# Patient Record
Sex: Male | Born: 1965 | Hispanic: Yes | Marital: Married | Smoking: Never smoker
Health system: Southern US, Community
[De-identification: ages and names within clinical notes are randomized; demographics above are authoritative.]

## PROBLEM LIST (undated history)

## (undated) ENCOUNTER — Emergency Department (HOSPITAL_COMMUNITY): Discharge status: Against Medical Advice | Payer: Self-pay

## (undated) HISTORY — PX: FRACTURE SURGERY: SHX138

## (undated) HISTORY — PX: OTHER SURGICAL HISTORY: SHX169

---

## 2012-09-12 ENCOUNTER — Encounter (HOSPITAL_COMMUNITY): Payer: Self-pay | Admitting: *Deleted

## 2012-09-12 ENCOUNTER — Emergency Department (HOSPITAL_COMMUNITY)
Admission: EM | Admit: 2012-09-12 | Discharge: 2012-09-12 | Disposition: A | Discharge status: Home / Self Care | Payer: Self-pay | Attending: Emergency Medicine | Admitting: Emergency Medicine

## 2012-09-12 DIAGNOSIS — L408 Other psoriasis: Secondary | ICD-10-CM | POA: Insufficient documentation

## 2012-09-12 DIAGNOSIS — L404 Guttate psoriasis: Secondary | ICD-10-CM

## 2012-09-12 MED ORDER — DIPHENHYDRAMINE HCL 25 MG PO TABS: 50.0000 mg | ORAL_TABLET | ORAL | Status: DC | PRN

## 2012-09-12 MED ORDER — DOXEPIN HCL 25 MG PO CAPS: 25.0000 mg | ORAL_CAPSULE | Freq: Every day | ORAL | Status: DC

## 2012-09-12 MED ORDER — PREDNISONE 20 MG PO TABS: ORAL_TABLET | ORAL | Status: DC

## 2012-09-12 MED ORDER — LORATADINE 10 MG PO TABS: 10.0000 mg | ORAL_TABLET | Freq: Every day | ORAL | Status: DC

## 2012-09-12 NOTE — ED Notes (Signed)
Itching rash for 3 weeks, thinks he is allergic to something at his work.

## 2012-09-12 NOTE — ED Provider Notes (Signed)
History   This chart was scribed for Hurman Horn, MD by Gerlean Ren. This patient was seen in room APA09/APA09 and the patient's care was started at 7:28PM.   CSN: [id number]  Arrival date & time 09/12/12  1914   First MD Initiated Contact with Patient 09/12/12 1924      Chief Complaint  Patient presents with  . Rash    (Consider location/radiation/quality/duration/timing/severity/associated sxs/prior treatment) The history is provided by the patient and a relative. No language interpreter was used.  Tyler Hudson is a 46 y.o. male who presents to the Emergency Department complaining of 3 weeks of constant itchy rash covering entire body for which pt has used prescribed steroid cream and Keflex with no improvements.  Per son, no known contacts with rash.  Pt denies fever, neck pain, sore throat, visual disturbance, CP, cough, SOB, abdominal pain, nausea, emesis, diarrhea, urinary symptoms, back pain, HA, weakness, and numbness as associated symptoms.  Pt has no h/o chronic medical conditions.  Pt denies tobacco and alcohol use.      History reviewed. No pertinent past medical history.  Past Surgical History  Procedure Date  . Fracture surgery   . Fracture surgery rt forearm     History reviewed. No pertinent family history.  History  Substance Use Topics  . Smoking status: Never Smoker   . Smokeless tobacco: Not on file  . Alcohol Use:       Review of Systems 10 Systems reviewed and are negative for acute change except as noted in the HPI.  Allergies  Review of patient's allergies indicates no known allergies.  Home Medications   Current Outpatient Rx  Name Route Sig Dispense Refill  . CEPHALEXIN 500 MG PO CAPS Oral Take 500 mg by mouth 2 (two) times daily. FOR 10 DAY    . HYDROCORTISONE 1 % EX CREA Topical Apply 1 application topically 2 (two) times daily as needed. FOR IRRITATION    . TRIAMCINOLONE ACETONIDE 0.1 % EX CREA Topical Apply 1 application topically 2  (two) times daily. FOR ITCHING AND/OR IRRITATION    . DIPHENHYDRAMINE HCL 25 MG PO TABS Oral Take 2 tablets (50 mg total) by mouth every 4 (four) hours as needed for itching. 20 tablet 0  . DOXEPIN HCL 25 MG PO CAPS Oral Take 1 capsule (25 mg total) by mouth at bedtime. 15 capsule 0  . LORATADINE 10 MG PO TABS Oral Take 1 tablet (10 mg total) by mouth daily. One po daily x 5 days 5 tablet 0  . PREDNISONE 20 MG PO TABS  3 tabs po daily x 3 days, then 2 tabs x 3 days, then 1.5 tabs x 3 days, then 1 tab x 3 days, then 0.5 tabs x 3 days 27 tablet 0    BP 121/65  Pulse 65  Temp 97.8 F (36.6 C) (Oral)  Resp 20  Wt 171 lb 1.6 oz (77.61 kg)  SpO2 97%  Physical Exam  Nursing note and vitals reviewed. Constitutional:       Awake, alert, nontoxic appearance.  HENT:  Head: Atraumatic.  Eyes: Right eye exhibits no discharge. Left eye exhibits no discharge.  Neck: Neck supple.  Cardiovascular: Normal rate and regular rhythm.   No murmur heard. Pulmonary/Chest: Effort normal. He has no wheezes. He exhibits no tenderness.  Abdominal: Soft. There is no tenderness. There is no rebound.  Musculoskeletal: He exhibits no tenderness.       Baseline ROM, no obvious new focal weakness.  Neurological:       Mental status and motor strength appears baseline for patient and situation.  Skin: No rash noted.       Generalized erythematous papules and plaques 1-3cm in diameter with some scaling and multiple superficial excoriations from scratching without vesicles, petechiae or purpura.  Healed lesions have post-inflammatory hyperpigmentation.    Psychiatric: He has a normal mood and affect.    ED Course  Procedures (including critical care time) DIAGNOSTIC STUDIES: Oxygen Saturation is 97% on room air, adequate by my interpretation.    COORDINATION OF CARE: 7:36PM- Patient and family informed of clinical course, understand medical decision-making process, and agree with plan.  Ordered deltasone,  benadryl, Claritin, and sinequan for discharge.    Labs Reviewed - No data to display No results found.   1. Guttate psoriasis       MDM   Pt stable in ED with no significant deterioration in condition.  Patient / Family / Caregiver informed of clinical course, understand medical decision-making process, and agree with plan.  I doubt any other EMC precluding discharge at this time including, but not necessarily limited to the following:SBI, TEN. SJS.  I personally performed the services described in this documentation, which was scribed in my presence. The recorded information has been reviewed and considered.        Hurman Horn, MD 09/14/12 [phone]

## 2012-09-12 NOTE — ED Notes (Signed)
Child at bedside interprets for his father and states that he has had a rash all over for a while.

## 2013-01-06 ENCOUNTER — Encounter (HOSPITAL_COMMUNITY): Payer: Self-pay | Admitting: *Deleted

## 2013-01-06 ENCOUNTER — Emergency Department (HOSPITAL_COMMUNITY)
Admission: EM | Admit: 2013-01-06 | Discharge: 2013-01-06 | Disposition: A | Discharge status: Home / Self Care | Payer: Self-pay | Attending: Emergency Medicine | Admitting: Emergency Medicine

## 2013-01-06 DIAGNOSIS — L409 Psoriasis, unspecified: Secondary | ICD-10-CM

## 2013-01-06 DIAGNOSIS — L408 Other psoriasis: Secondary | ICD-10-CM | POA: Insufficient documentation

## 2013-01-06 DIAGNOSIS — L299 Pruritus, unspecified: Secondary | ICD-10-CM | POA: Insufficient documentation

## 2013-01-06 MED ORDER — PREDNISONE 10 MG PO TABS
60.0000 mg | ORAL_TABLET | Freq: Once | ORAL | Status: AC
  Administered 2013-01-06: 60 mg via ORAL
  Filled 2013-01-06: qty 1

## 2013-01-06 MED ORDER — PREDNISONE 10 MG PO TABS: ORAL_TABLET | ORAL | Status: DC

## 2013-01-06 MED ORDER — HYDROXYZINE HCL 25 MG PO TABS
25.0000 mg | ORAL_TABLET | Freq: Once | ORAL | Status: AC
  Administered 2013-01-06: 25 mg via ORAL
  Filled 2013-01-06: qty 1

## 2013-01-06 MED ORDER — HYDROXYZINE HCL 25 MG PO TABS: 25.0000 mg | ORAL_TABLET | Freq: Four times a day (QID) | ORAL | Status: DC

## 2013-01-06 NOTE — ED Provider Notes (Signed)
History     CSN: [id number]  Arrival date & time 01/06/13  1851   First MD Initiated Contact with Patient 01/06/13 1912      No chief complaint on file.   (Consider location/radiation/quality/duration/timing/severity/associated sxs/prior treatment) HPI Comments: Patient c/o chronic rash and itching for 8-9 months.  States he was seen here previously for same but symptoms have not improved.  He states the lesions began on his legs and are now present to most of his body.  He denies new medications, exposure to chemicals, recent travel out of the country, fever, joint pain or swelling.  PAtient   Patient is a 47 y.o. male presenting with rash. The history is provided by the patient.  Rash  This is a chronic problem. Episode onset: 8 months. The problem has been gradually worsening. The problem is associated with an unknown factor. There has been no fever. The rash is present on the back, abdomen, groin, trunk, left upper leg, left lower leg, right upper leg, right lower leg, left arm and right arm. The patient is experiencing no pain. Associated symptoms include itching. Pertinent negatives include no blisters, no pain and no weeping. He has tried anti-itch cream and steriods for the symptoms. The treatment provided mild relief.    History reviewed. No pertinent past medical history.  Past Surgical History  Procedure Date  . Fracture surgery   . Fracture surgery rt forearm     History reviewed. No pertinent family history.  History  Substance Use Topics  . Smoking status: Never Smoker   . Smokeless tobacco: Not on file  . Alcohol Use: No      Review of Systems  Constitutional: Negative for fever, chills, activity change and appetite change.  HENT: Negative for sore throat, facial swelling, trouble swallowing, neck pain and neck stiffness.   Respiratory: Negative for chest tightness, shortness of breath and wheezing.   Gastrointestinal: Negative for nausea and vomiting.    Genitourinary: Negative for dysuria.  Musculoskeletal: Negative for myalgias, arthralgias and gait problem.  Skin: Positive for itching and rash. Negative for color change and wound.  Neurological: Negative for dizziness, weakness, numbness and headaches.  Hematological: Negative for adenopathy.  All other systems reviewed and are negative.    Allergies  Review of patient's allergies indicates no known allergies.  Home Medications   Current Outpatient Rx  Name  Route  Sig  Dispense  Refill  . HYDROCORTISONE 1 % EX CREA   Topical   Apply 1 application topically 2 (two) times daily as needed. FOR IRRITATION         . HYDROXYZINE HCL 25 MG PO TABS   Oral   Take 1 tablet (25 mg total) by mouth every 6 (six) hours. Prn itching   20 tablet   0   . PREDNISONE 10 MG PO TABS      Take 6 tablets day one, 5 tablets day two, 4 tablets day three, 3 tablets day four, 2 tablets day five, then 1 tablet day six   21 tablet   0     BP 131/71  Pulse 60  Temp 98.7 F (37.1 C) (Oral)  Resp 20  Ht 5\' 5"  (1.651 m)  Wt 173 lb 2 oz (78.529 kg)  BMI 28.81 kg/m2  SpO2 100%  Physical Exam  Nursing note and vitals reviewed. Constitutional: He is oriented to person, place, and time. He appears well-developed and well-nourished. No distress.  HENT:  Head: Normocephalic and atraumatic.  Mouth/Throat:  Oropharynx is clear and moist.  Neck: Normal range of motion. Neck supple.  Cardiovascular: Normal rate, regular rhythm, normal heart sounds and intact distal pulses.   No murmur heard. Pulmonary/Chest: Effort normal and breath sounds normal.  Musculoskeletal: He exhibits no edema and no tenderness.  Lymphadenopathy:    He has no cervical adenopathy.  Neurological: He is alert and oriented to person, place, and time. He exhibits normal muscle tone. Coordination normal.  Skin: Skin is warm and dry. Rash noted. No erythema.       Multiple erythematous , scaly plaques of various sizes to most  of the body.  Scattered excoriations.  No bulla , weeping or vesicles    ED Course  Procedures (including critical care time)  Labs Reviewed - No data to display No results found.   1. Psoriasis       MDM   Previous ED chart reviewed.    Patient was seen here in October for same.  Has not seen dermatology or had a follow-up appt .  I have discussed at length with the patient that he will close f/u with dermatology for this condition.  Referral info given.  Pt is otherwise well appearing    Prescribed: Prednisone taper vistaril  Tyler Hudson L. [place], [place] 01/07/13 [phone]

## 2013-01-06 NOTE — ED Notes (Signed)
Pt reporting generalized rash for about 8 months.  Reports no relief from OTC creams.

## 2013-01-06 NOTE — ED Notes (Signed)
Generalized, itching rash present on legs x 2 years, present on trunk for 8 mos.

## 2013-01-07 NOTE — ED Provider Notes (Signed)
Medical screening examination/treatment/procedure(s) were performed by non-physician practitioner and as supervising physician I was immediately available for consultation/collaboration.  As discussed with [name], patient needs dermatology follow up. Referral options given.   Shelda Jakes, MD 01/07/13 [phone]

## 2013-09-28 ENCOUNTER — Emergency Department (HOSPITAL_COMMUNITY)
Admission: EM | Admit: 2013-09-28 | Discharge: 2013-09-28 | Disposition: A | Discharge status: Home / Self Care | Payer: Self-pay | Attending: Emergency Medicine | Admitting: Emergency Medicine

## 2013-09-28 ENCOUNTER — Encounter (HOSPITAL_COMMUNITY): Payer: Self-pay | Admitting: Emergency Medicine

## 2013-09-28 ENCOUNTER — Emergency Department (HOSPITAL_COMMUNITY): Payer: Self-pay

## 2013-09-28 DIAGNOSIS — L0201 Cutaneous abscess of face: Secondary | ICD-10-CM | POA: Insufficient documentation

## 2013-09-28 DIAGNOSIS — L03211 Cellulitis of face: Secondary | ICD-10-CM | POA: Insufficient documentation

## 2013-09-28 LAB — BASIC METABOLIC PANEL
CO2: 30 mEq/L (ref 19–32)
Calcium: 9.3 mg/dL (ref 8.4–10.5)
GFR calc non Af Amer: >90 mL/min (ref >90)
Glucose, Bld: 104 mg/dL — ABNORMAL HIGH (ref 70–99)
Potassium: 4.1 mEq/L (ref 3.5–5.1)
Sodium: 135 mEq/L (ref 135–145)

## 2013-09-28 LAB — CBC WITH DIFFERENTIAL/PLATELET
Basophils Absolute: 0 10*3/uL (ref 0.0–0.1)
Eosinophils Relative: 2 % (ref 0–5)
Lymphocytes Relative: 16 % (ref 12–46)
Lymphs Abs: 1.8 10*3/uL (ref 0.7–4.0)
Neutro Abs: 7.8 10*3/uL — ABNORMAL HIGH (ref 1.7–7.7)
Neutrophils Relative %: 71 % (ref 43–77)
Platelets: 148 10*3/uL — ABNORMAL LOW (ref 150–400)
RBC: 5.3 MIL/uL (ref 4.22–5.81)
RDW: 12.7 % (ref 11.5–15.5)
WBC: 11.1 10*3/uL — ABNORMAL HIGH (ref 4.0–10.5)

## 2013-09-28 MED ORDER — IOHEXOL 300 MG/ML  SOLN
100.0000 mL | Freq: Once | INTRAMUSCULAR | Status: AC | PRN
  Administered 2013-09-28: 75 mL via INTRAVENOUS

## 2013-09-28 MED ORDER — CLINDAMYCIN HCL 150 MG PO CAPS: ORAL_CAPSULE | ORAL | Status: DC

## 2013-09-28 MED ORDER — SODIUM CHLORIDE 0.9 % IV SOLN
INTRAVENOUS | Status: DC
  Administered 2013-09-28: 15:00:00 via INTRAVENOUS

## 2013-09-28 MED ORDER — CLINDAMYCIN PHOSPHATE 900 MG/50ML IV SOLN
900.0000 mg | Freq: Once | INTRAVENOUS | Status: AC
  Administered 2013-09-28: 900 mg via INTRAVENOUS
  Filled 2013-09-28: qty 50

## 2013-09-28 NOTE — ED Notes (Addendum)
C/O  L earache x 3 days.    Redness and swelling noted to left cheek.

## 2013-09-28 NOTE — ED Provider Notes (Signed)
CSN: [id number]     Arrival date & time 09/28/13  1245 History   First MD Initiated Contact with Patient 09/28/13 1300     Chief Complaint  Patient presents with  . Otalgia    HPI Pt was seen at 1435. Per pt and his family, c/o gradual onset and persistence of constant left facial "rash" for the past 3 days. Has been associated with left ear "pain." Denies injury, no fevers, no open wounds, no mouth pain, no eye pain, no headache, no neck pain, no SOB.    History reviewed. No pertinent past medical history.  Past Surgical History  Procedure Laterality Date  . Fracture surgery    . Fracture surgery rt forearm      History  Substance Use Topics  . Smoking status: Never Smoker   . Smokeless tobacco: Not on file  . Alcohol Use: No    Review of Systems ROS: Statement: All systems negative except as marked or noted in the HPI; Constitutional: Negative for fever and chills. ; ; Eyes: Negative for eye pain, redness and discharge. ; ; ENMT: Negative for ear pain, hoarseness, nasal congestion, sinus pressure and sore throat. ; ; Cardiovascular: Negative for chest pain, palpitations, diaphoresis, dyspnea and peripheral edema. ; ; Respiratory: Negative for cough, wheezing and stridor. ; ; Gastrointestinal: Negative for nausea, vomiting, diarrhea, abdominal pain, blood in stool, hematemesis, jaundice and rectal bleeding. . ; ; Genitourinary: Negative for dysuria, flank pain and hematuria. ; ; Musculoskeletal: Negative for back pain and neck pain. Negative for swelling and trauma.; ; Skin: +left facial rash. Negative for pruritus, abrasions, blisters, bruising and skin lesion.; ; Neuro: Negative for headache, lightheadedness and neck stiffness. Negative for weakness, altered level of consciousness , altered mental status, extremity weakness, paresthesias, involuntary movement, seizure and syncope.       Allergies  Review of patient's allergies indicates no known allergies.  Home Medications    Current Outpatient Rx  Name  Route  Sig  Dispense  Refill  . hydrocortisone cream (ANTI-ITCH MAXIMUM STRENGTH) 1 %   Topical   Apply 1 application topically 2 (two) times daily as needed. FOR IRRITATION         . hydrOXYzine (ATARAX/VISTARIL) 25 MG tablet   Oral   Take 1 tablet (25 mg total) by mouth every 6 (six) hours. Prn itching   20 tablet   0   . predniSONE (DELTASONE) 10 MG tablet      Take 6 tablets day one, 5 tablets day two, 4 tablets day three, 3 tablets day four, 2 tablets day five, then 1 tablet day six   21 tablet   0    BP 115/69  Pulse 74  Temp(Src) 98.8 F (37.1 C) (Oral)  Resp 18  Ht 5\' 2"  (1.575 m)  Wt 166 lb (75.297 kg)  BMI 30.35 kg/m2  SpO2 96% Physical Exam 1440: Physical examination:  Nursing notes reviewed; Vital signs and O2 SAT reviewed;  Constitutional: Well developed, Well nourished, Well hydrated, In no acute distress; Head:  Normocephalic, atraumatic. +approx 6x7cm diameter area on left lateral zygoma/cheek with erythema and induration, no tenderness, no blisters/vesicles, no open wounds, no palp abscess/fluctuance, no ecchymosis, no soft tissue crepitus. ; Eyes: EOMI, PERRL, No scleral icterus; ENMT: TM's clear bilat. Left external auditory canal without edema, debris, open wounds, erythema. Left pinnae without edema or erythema. Mouth and pharynx normal, Mucous membranes moist; Neck: Supple, Full range of motion, No lymphadenopathy; Cardiovascular: Regular rate and rhythm,  No gallop; Respiratory: Breath sounds clear & equal bilaterally, No rales, rhonchi, wheezes.  Speaking full sentences with ease, Normal respiratory effort/excursion; Chest: Nontender, Movement normal; Abdomen: Soft, Nontender, Nondistended, Normal bowel sounds; Genitourinary: No CVA tenderness; Extremities: Pulses normal, No tenderness, No edema, No calf edema or asymmetry.; Neuro: AA&Ox3, Major CN grossly intact.  Speech clear. Climbs on and off stretcher easily by himself. Gait  steady. No gross focal motor or sensory deficits in extremities.; Skin: Color normal, Warm, Dry.   ED Course  Procedures   EKG Interpretation   None       MDM  MDM Reviewed: previous chart, nursing note and vitals Interpretation: labs and CT scan   Results for orders placed during the hospital encounter of 09/28/13  CBC WITH DIFFERENTIAL      Result Value Range   WBC 11.1 (*) 4.0 - 10.5 K/uL   RBC 5.30  4.22 - 5.81 MIL/uL   Hemoglobin 16.2  13.0 - 17.0 g/dL   HCT 16.1  09.6 - 04.5 %   MCV 88.5  78.0 - 100.0 fL   MCH 30.6  26.0 - 34.0 pg   MCHC 34.5  30.0 - 36.0 g/dL   RDW 40.9  81.1 - 91.4 %   Platelets 148 (*) 150 - 400 K/uL   Neutrophils Relative % 71  43 - 77 %   Neutro Abs 7.8 (*) 1.7 - 7.7 K/uL   Lymphocytes Relative 16  12 - 46 %   Lymphs Abs 1.8  0.7 - 4.0 K/uL   Monocytes Relative 11  3 - 12 %   Monocytes Absolute 1.2 (*) 0.1 - 1.0 K/uL   Eosinophils Relative 2  0 - 5 %   Eosinophils Absolute 0.2  0.0 - 0.7 K/uL   Basophils Relative 0  0 - 1 %   Basophils Absolute 0.0  0.0 - 0.1 K/uL  BASIC METABOLIC PANEL      Result Value Range   Sodium 135  135 - 145 mEq/L   Potassium 4.1  3.5 - 5.1 mEq/L   Chloride 101  96 - 112 mEq/L   CO2 30  19 - 32 mEq/L   Glucose, Bld 104 (*) 70 - 99 mg/dL   BUN 18  6 - 23 mg/dL   Creatinine, Ser 7.82  0.50 - 1.35 mg/dL   Calcium 9.3  8.4 - 95.6 mg/dL   GFR calc non Af Amer >90  >90 mL/min   GFR calc Af Amer >90  >90 mL/min   Ct Maxillofacial W/cm 09/28/2013   CLINICAL DATA:  Aerated. Left facial swelling.  EXAM: CT MAXILLOFACIAL WITH CONTRAST  TECHNIQUE: Multidetector CT imaging of the maxillofacial structures was performed with intravenous contrast. Multiplanar CT image reconstructions were also generated. A small metallic BB was placed on the right temple in order to reliably differentiate right from left.  CONTRAST:  75mL OMNIPAQUE IOHEXOL 300 MG/ML  SOLN  COMPARISON:  None.  FINDINGS: Inflammatory changes of the subcutaneous  tissues over the left parotid gland are noted. No evidence of inflammatory change within the parotid gland. Inflammatory change of the soft tissue of the external left auditory canal is present. This extends superiorly into the temporal region but again only involves the overlying subcutaneous tissues. Small inflammatory nodes are seen about the left parotid gland.  There is no evidence of abscess  Airway is patent. Larynx and epiglottis are unremarkable. Thyroid gland was not imaged  Jugular veins are patent. Carotid arteries are grossly patent.  No abnormal adenopathy by measurement criteria.  Visualized mastoid air cells are clear. Minimal mucosal thickening in the maxillary sinuses.  IMPRESSION: Inflammatory changes of the soft tissues involving the left external auditory canal and soft tissues overlying the left parotid gland. No abscess.   Electronically Signed   By: Maryclare Bean M.D.   On: 09/28/2013 15:35     1705:  IV clindamycin given while in the ED. No abscess via CT scan. Pt remains afebrile with stable VS. Pt wants to go home. Pt strongly encouraged to f/u with PMD in the next 24 to 48 hours for re-check; will rx clindamycin. Verb understanding and agreeable. Dx and testing d/w pt and family.  Questions answered.  Verb understanding, agreeable to d/c home with outpt f/u.     Laray Anger, DO 09/30/13 1126

## 2014-09-18 ENCOUNTER — Encounter (HOSPITAL_COMMUNITY): Payer: Self-pay | Admitting: Emergency Medicine

## 2014-09-18 ENCOUNTER — Emergency Department (HOSPITAL_COMMUNITY): Payer: Self-pay

## 2014-09-18 ENCOUNTER — Emergency Department (HOSPITAL_COMMUNITY)
Admission: EM | Admit: 2014-09-18 | Discharge: 2014-09-19 | Disposition: A | Discharge status: Home / Self Care | Payer: Self-pay | Attending: Emergency Medicine | Admitting: Emergency Medicine

## 2014-09-18 DIAGNOSIS — Z79899 Other long term (current) drug therapy: Secondary | ICD-10-CM | POA: Insufficient documentation

## 2014-09-18 DIAGNOSIS — Z7952 Long term (current) use of systemic steroids: Secondary | ICD-10-CM | POA: Insufficient documentation

## 2014-09-18 DIAGNOSIS — R319 Hematuria, unspecified: Secondary | ICD-10-CM | POA: Insufficient documentation

## 2014-09-18 DIAGNOSIS — R109 Unspecified abdominal pain: Secondary | ICD-10-CM

## 2014-09-18 LAB — URINE MICROSCOPIC-ADD ON

## 2014-09-18 LAB — URINALYSIS, ROUTINE W REFLEX MICROSCOPIC
BILIRUBIN URINE: NEGATIVE
Glucose, UA: NEGATIVE mg/dL
KETONES UR: NEGATIVE mg/dL
Leukocytes, UA: NEGATIVE
NITRITE: NEGATIVE
PH: 6 (ref 5.0–8.0)
Protein, ur: NEGATIVE mg/dL
Specific Gravity, Urine: 1.02 (ref 1.005–1.030)
Urobilinogen, UA: 1 mg/dL (ref 0.0–1.0)

## 2014-09-18 NOTE — ED Provider Notes (Signed)
CSN: [id number]     Arrival date & time 09/18/14  2144 History   First MD Initiated Contact with Patient 09/18/14 2242    This chart was scribed for non-physician practitioner, Ivery QualeHobson Jathen Sudano, PA, working with Vida RollerBrian D Miller, MD by Marica OtterNusrat Rahman, ED Scribe. This patient was seen in room APA05/APA05 and the patient's care was started at 10:45 PM.  Chief Complaint  Patient presents with  . Abdominal Pain   HPI PCP: No PCP Per Patient HPI Comments: Tyler Hudson is a 48 y.o. male who presents to the Emergency Department complaining of atraumatic, intermittent, RLQ abd pain onset one week ago. Pt also complains of intermittent heat in the said area. Pt notes the pain is worse when he eats or when there is movement (including walking, standing from a sitting position, sitting from a laying position). Pt denies fever, constipation, diarrhea, blood in stool, blood in urine, Hx of similar pain, chronic medical problems, n/v.  History reviewed. No pertinent past medical history. History reviewed. No pertinent past surgical history. History reviewed. No pertinent family history. History  Substance Use Topics  . Smoking status: Never Smoker   . Smokeless tobacco: Not on file  . Alcohol Use: No    Review of Systems  Gastrointestinal: Positive for abdominal pain (RLQ). Negative for nausea, vomiting, diarrhea, constipation and blood in stool.  Psychiatric/Behavioral: Negative for confusion.  All other systems reviewed and are negative.   Allergies  Review of patient's allergies indicates no known allergies.  Home Medications   Prior to Admission medications   Medication Sig Start Date End Date Taking? Authorizing Provider  clindamycin (CLEOCIN) 150 MG capsule 3 tabs PO TID x 10 days 09/28/13   Samuel JesterKathleen McManus, DO  hydrocortisone cream (ANTI-ITCH MAXIMUM STRENGTH) 1 % Apply 1 application topically 2 (two) times daily as needed. FOR IRRITATION    Historical Provider, MD  hydrOXYzine  (ATARAX/VISTARIL) 25 MG tablet Take 1 tablet (25 mg total) by mouth every 6 (six) hours. Prn itching 01/06/13   [name] L. Triplett, PA-C  predniSONE (DELTASONE) 10 MG tablet Take 6 tablets day one, 5 tablets day two, 4 tablets day three, 3 tablets day four, 2 tablets day five, then 1 tablet day six 01/06/13   [name] L. Rowe Robertriplett, PA-C    Physical Exam  Nursing note and vitals reviewed. Constitutional: He is oriented to person, place, and time. He appears well-developed and well-nourished. No distress.  HENT:  Head: Normocephalic and atraumatic.  Mouth/Throat: Oropharynx is clear and moist.  Eyes: Conjunctivae and EOM are normal.  Neck: Normal range of motion. Neck supple. No tracheal deviation present.  Cardiovascular: Normal rate.   Pulmonary/Chest: Effort normal. No respiratory distress.  Abdominal: Bowel sounds are normal. There is tenderness (RLQ pain to palpation ).  No left side pain. Right CVA tenderness  Musculoskeletal: Normal range of motion.  Lymphadenopathy:    He has no cervical adenopathy.  Neurological: He is alert and oriented to person, place, and time.  Skin: Skin is warm and dry.  Psychiatric: He has a normal mood and affect. His behavior is normal.    ED Course  Procedures (including critical care time)   COORDINATION OF CARE: 10:52 PM-Discussed treatment plan which includes labs, UA, and imaging with pt at bedside and pt agreed to plan.   Labs Review Labs Reviewed  URINALYSIS, ROUTINE W REFLEX MICROSCOPIC - Abnormal; Notable for the following:    Hgb urine dipstick LARGE (*)    All other components within normal  limits  URINE MICROSCOPIC-ADD ON    Imaging Review No results found.   EKG Interpretation None      MDM  Patient is with one week of right lower back pain and right lower quadrant abdomen pain. No reported injury, and no fever reported. Urinalysis reveals a large hemoglobin on the urine analysis, and 21-50 red blood cells present.. A CT scan is  to be obtained.  CT scan reveals no acute abnormality of the abdomen or pelvis. 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  Patient will be advised to see urology for evaluation of the hematuria. Patient will be treated with Norco every 4 hours for pain as well as baclofen. Patient will be advised to return sooner if any changes or problems.    Final diagnoses:  None    *I have reviewed nursing notes, vital signs, and all appropriate lab and imaging results for this patient.**  **I personally performed the services described in this documentation, which was scribed in my presence. The recorded information has been reviewed and is accurate.Kathie Dike*   Concettina Leth M Quintasia Theroux, PA-C 09/19/14 [phone]

## 2014-09-18 NOTE — ED Notes (Signed)
Patient states RLQ pain X1 week that in worse after he eats. Denies N/V/D

## 2014-09-19 MED ORDER — HYDROCODONE-ACETAMINOPHEN 5-325 MG PO TABS: 1.0000 | ORAL_TABLET | ORAL | Status: AC | PRN

## 2014-09-19 MED ORDER — METHOCARBAMOL 500 MG PO TABS: 500.0000 mg | ORAL_TABLET | Freq: Three times a day (TID) | ORAL | Status: AC

## 2014-09-19 MED ORDER — METHOCARBAMOL 500 MG PO TABS
1000.0000 mg | ORAL_TABLET | Freq: Once | ORAL | Status: AC
  Administered 2014-09-19: 1000 mg via ORAL
  Filled 2014-09-19: qty 2

## 2014-09-19 MED ORDER — HYDROCODONE-ACETAMINOPHEN 5-325 MG PO TABS
2.0000 | ORAL_TABLET | Freq: Once | ORAL | Status: AC
  Administered 2014-09-19: 2 via ORAL
  Filled 2014-09-19: qty 2

## 2014-09-19 NOTE — ED Provider Notes (Signed)
Medical screening examination/treatment/procedure(s) were performed by non-physician practitioner and as supervising physician I was immediately available for consultation/collaboration.    Laikyn Gewirtz D Aleighna Wojtas, MD 09/19/14 1616 

## 2014-09-19 NOTE — ED Notes (Signed)
Patient verbalizes understanding of discharge instructions, follow up care, prescription medications, and home care. Patient ambulatory out of department at this time, escorted by family. 

## 2014-09-19 NOTE — Discharge Instructions (Signed)
Your urine analysis revealed some blood in the urine. 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 Please call the urology specialist listed above for additional evaluation and management of this blood in the urine. Please use Robaxin and Norco for pain. These medications may cause drowsiness, please use with caution. Please strain all urine until seen by the urology specialist. Hematuria (Hematuria) La hematuria es la presencia de sangre en la orina. La causa puede ser una infeccin en la vejiga, en los riones, en la prstata, clculos renales o cncer de las vas urinarias. Normalmente las infecciones pueden tratarse con medicamentos, y los clculos renales, por lo general, se eliminarn a travs de la orina. Si ninguna de estas es la causa de su hematuria, quizs se necesiten ms estudios para Building services engineerdescubrir el motivo. Es muy importante que le informe a su mdico si ve sangre en la [place], aunque el sangrado se detenga sin tratamiento o no cause dolor. La sangre que aparece en la orina y luego se detiene y vuelve a aparecer nuevamente puede ser un sntoma de una enfermedad muy grave. Adems, el dolor no es un sntoma en las etapas iniciales de muchos tipos de cncer urinarios. INSTRUCCIONES PARA EL CUIDADO EN EL HOGAR   Beba mucho lquido, entre 3 a Risk analyst4litros por da. Si le han diagnosticado una infeccin, se recomienda especialmente el jugo de arndanos rojos, [address] de grandes cantidades de [place].  Evite la cafena, el t y las bebidas con gas porque suelen irritar la vejiga.  Evite el alcohol ya que puede Teacher, adult educationirritar la prstata.  Tome todos los [employer] Companymedicamentos como se lo haya indicado el mdico.  Si le recetaron antibiticos, asegrese de terminarlos, incluso si comienza a sentirse mejor.  Si le han diagnosticado clculos renales, siga las instrucciones de su mdico con respecto a Ecologistfiltrar la orina para TEFL teacherrescatar el clculo.  Vace la vejiga con frecuencia. Evite  retener la orina durante largos perodos.  Despus de defecar, las mujeres deben higienizarse desde adelante hacia atrs. Use solo un papel por vez.  Si es AmerisourceBergen Corporationuna mujer, vace la vejiga antes y despus de Management consultanttener relaciones sexuales. SOLICITE ATENCIN MDICA SI:  Siente dolor en la espalda.  Tiene fiebre.  Tiene sensacin de Programme researcher, broadcasting/film/videomalestar estomacal (nuseas) o vmitos.  Los sntomas no mejoran despus de [address] das. Si la condicin empeora, visite al mdico antes de lo previsto. SOLICITE ATENCIN MDICA DE INMEDIATO SI:   Vomita con frecuencia e intensamente y no puede tolerar los medicamentos.  Siente un dolor intenso en la espalda o el abdomen incluso tomando los medicamentos.  Elimina cogulos grandes o sangre con la [place].  Se siente extremadamente dbil, se desmaya o pierde el conocimiento. ASEGRESE DE QUE:   Comprende estas instrucciones.  Controlar su afeccin.  Recibir ayuda de inmediato si no mejora o si empeora. Document Released: 11/27/2005 Document Revised: 04/13/2014 [facility] Patient Information 2015 [employer], [place]. This information is not intended to replace advice given to you by your health care provider. Make sure you discuss any questions you have with your health care provider.

## 2015-08-27 IMAGING — CT CT RENAL STONE PROTOCOL
2 of 4 series · 16 of 46 positions shown, 18 images · non-contrast
Comparison: None.

CLINICAL DATA: Acute onset of right flank pain for 2 weeks.

EXAM:
CT ABDOMEN AND PELVIS WITHOUT CONTRAST
TECHNIQUE: Multidetector CT imaging of the abdomen and pelvis was performed
following the standard protocol without IV contrast.

[Series 2: standard/full over (age)lbs 5.0 · axial · 0.69mm/px · z∈[-458,-22]mm · 13 of 95 slices shown, 15 images]
[im 4/95  soft-tissue]
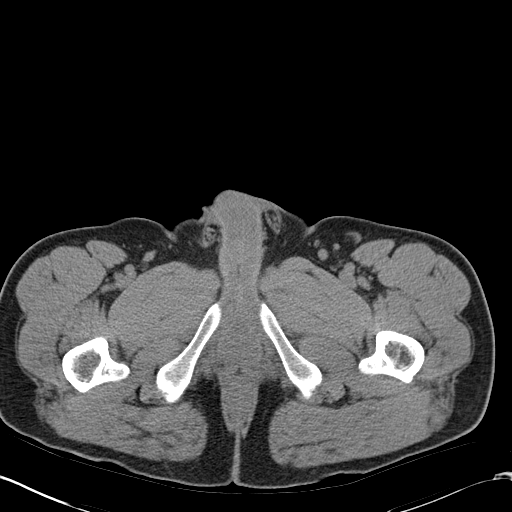
[im 4/95  bone]
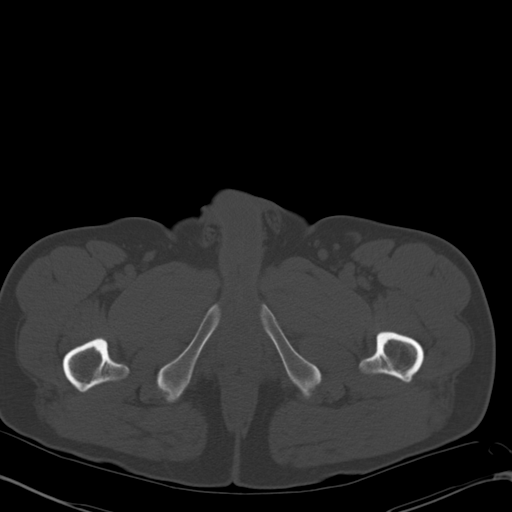
[im 11/95  soft-tissue]
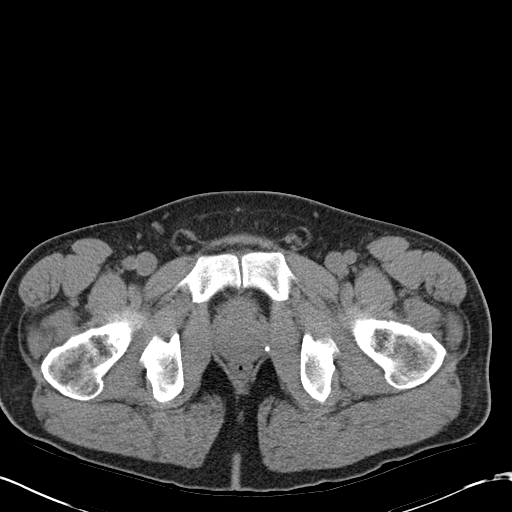
[im 19/95  soft-tissue]
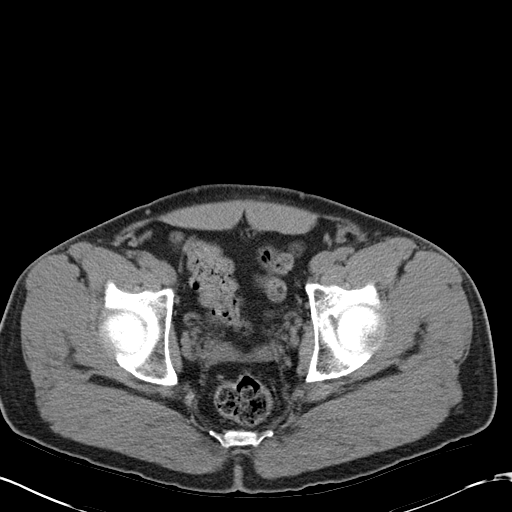
[im 26/95  soft-tissue]
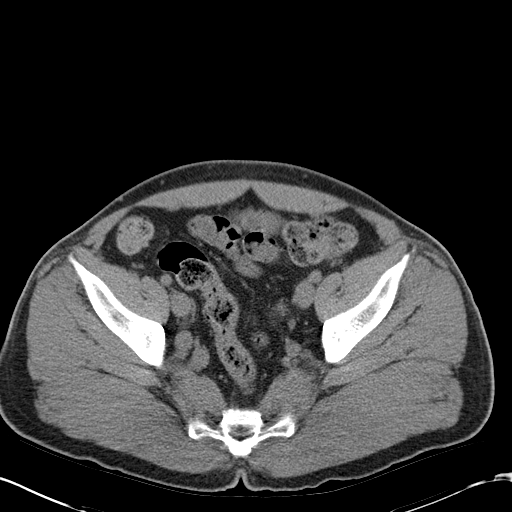
[im 33/95  soft-tissue]
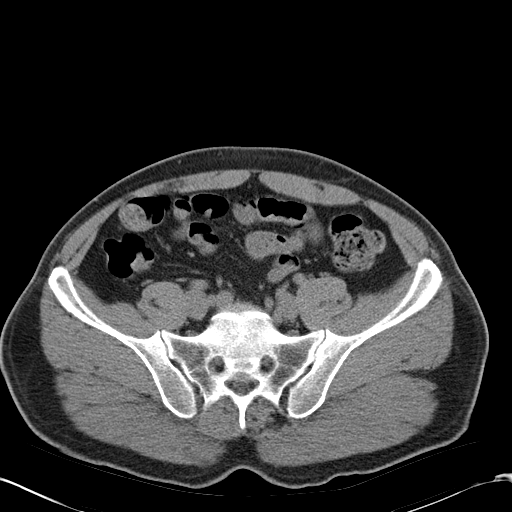
[im 40/95  soft-tissue]
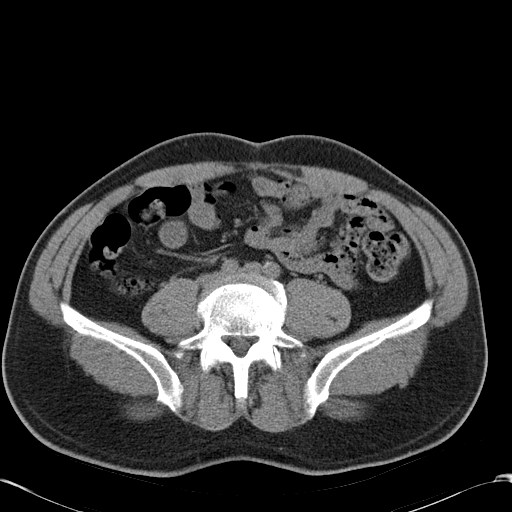
[im 48/95  soft-tissue]
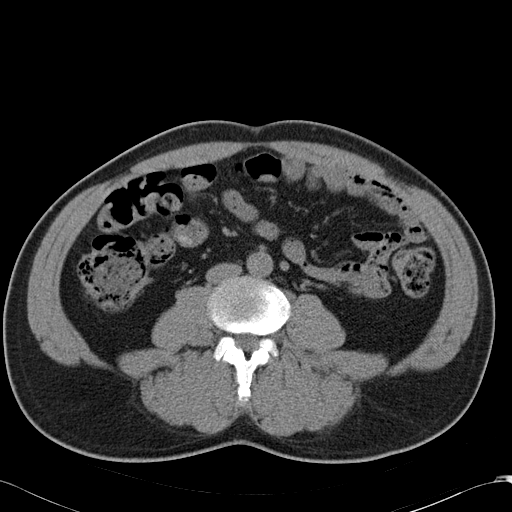
[im 55/95  soft-tissue]
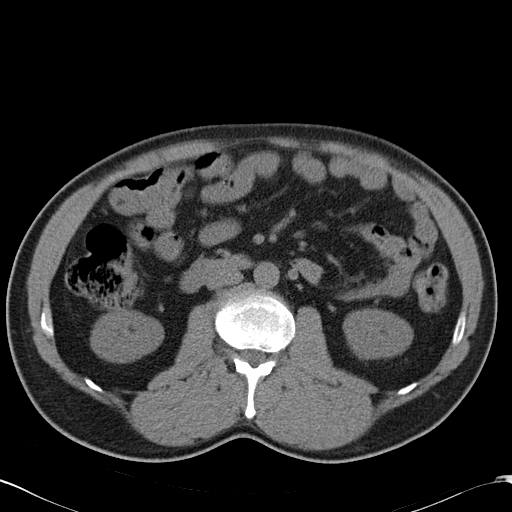
[im 62/95  soft-tissue]
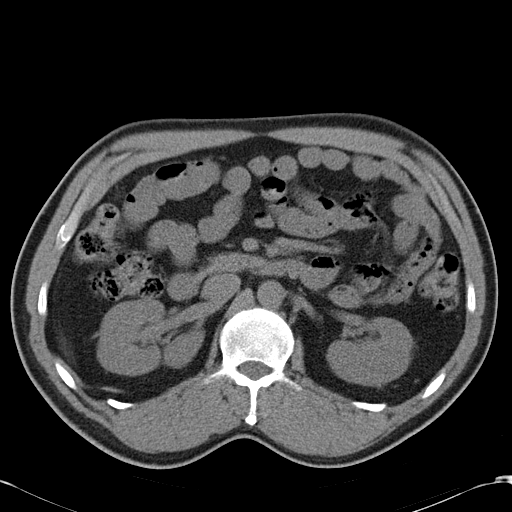
[im 62/95  bone]
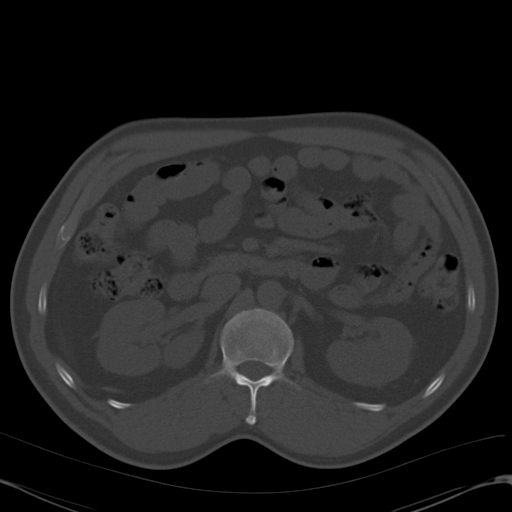
[im 69/95  soft-tissue]
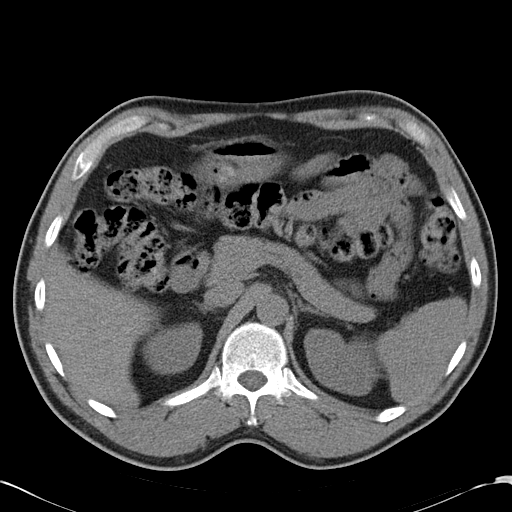
[im 76/95  soft-tissue]
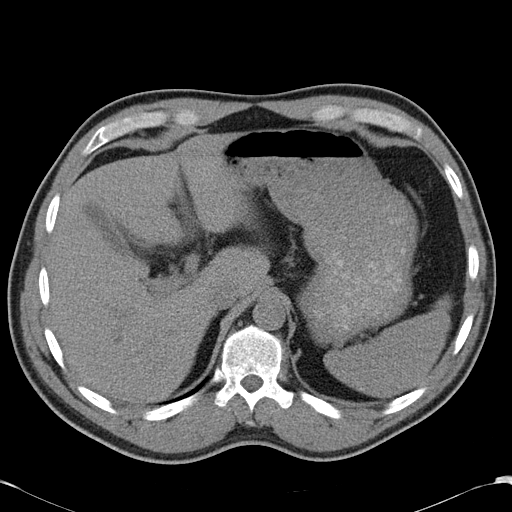
[im 84/95  soft-tissue]
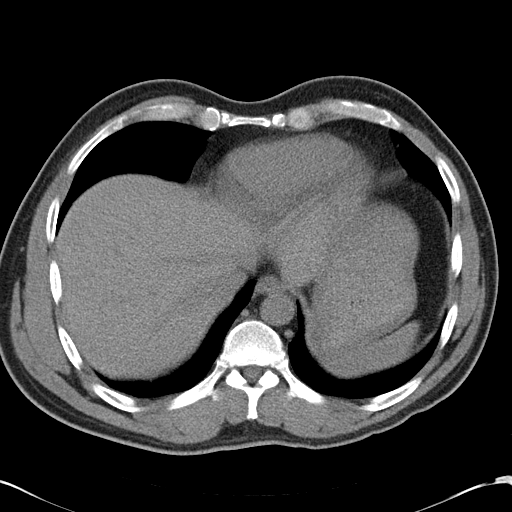
[im 91/95  soft-tissue]
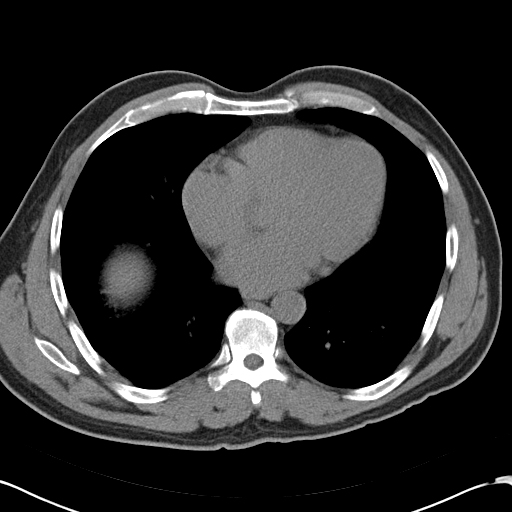

[Series 4: mpr coronal · coronal · 0.72mm/px · 3 of 87 slices shown]
[im 29/87  soft-tissue]
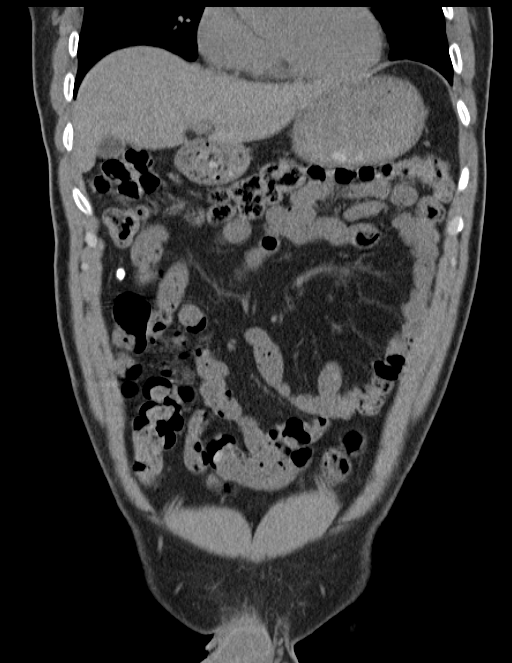
[im 39/87  soft-tissue]
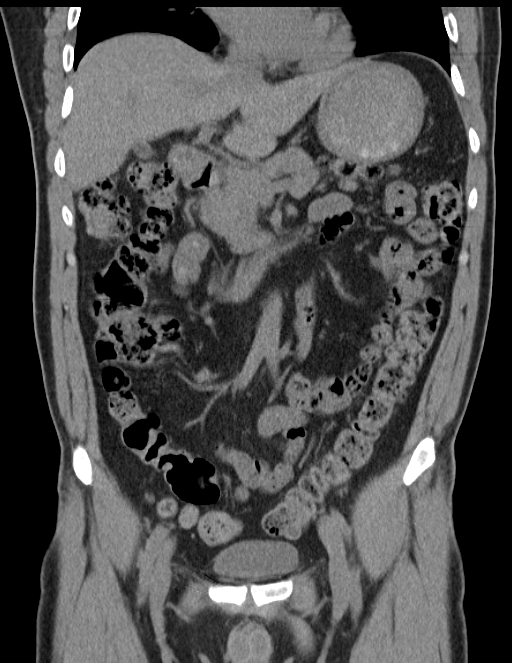
[im 48/87  soft-tissue]
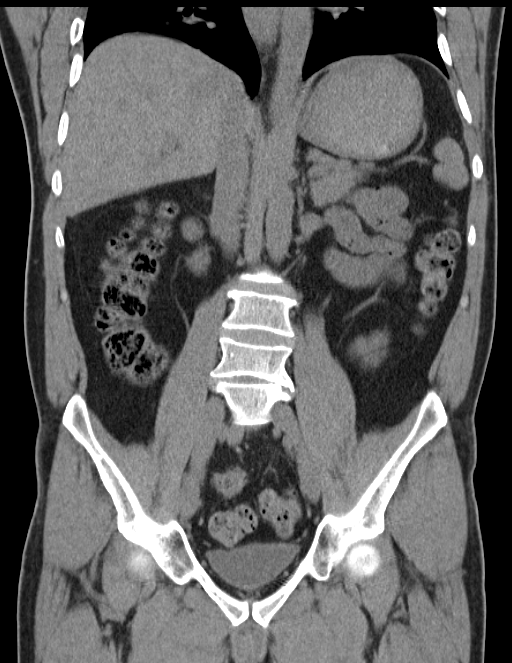

[16 of 46 positions shown; findings below may reference images not displayed]

FINDINGS: The visualized lung bases are clear.

The liver is unremarkable in appearance. A calcified granuloma is
noted in the periphery of the spleen. The gallbladder is within
normal limits. The pancreas and adrenal glands are unremarkable.

The kidneys are unremarkable in appearance. There is no evidence of
hydronephrosis. No renal or ureteral stones are seen. No perinephric
stranding is appreciated.

No free fluid is identified. The small bowel is unremarkable in
appearance. The stomach is within normal limits. No acute vascular
abnormalities are seen.

The appendix is normal in caliber, without evidence for
appendicitis. The sigmoid colon is somewhat redundant. The colon is
largely filled with stool and is unremarkable in appearance.

The bladder is mildly distended and grossly unremarkable. The
prostate remains normal in size. No inguinal lymphadenopathy is
seen.

No acute osseous abnormalities are identified. Chronic bilateral
pars defects are seen at L4, with grade 1 anterolisthesis of L4 on
L5.
IMPRESSION: 1. No acute abnormality seen within the abdomen or pelvis.
2. Chronic bilateral pars defects noted at L4, with grade 1
anterolisthesis of L4 on L5.

## 2019-10-28 ENCOUNTER — Other Ambulatory Visit: Payer: Self-pay

## 2019-10-28 DIAGNOSIS — Z20822 Contact with and (suspected) exposure to covid-19: Secondary | ICD-10-CM

## 2019-10-30 LAB — NOVEL CORONAVIRUS, NAA: SARS-CoV-2, NAA: DETECTED — AB

## 2019-11-01 ENCOUNTER — Telehealth: Payer: Self-pay

## 2019-11-01 NOTE — Telephone Encounter (Signed)
Pt given Covid-19 positive results. Discussed mild, moderate and severe symptoms. Advised pt to call 911 for any respiratory issues and/dehydration. Discussed non test criteria for ending self isolation. Pt advised of way to manage symptoms at home and review isolation precautions especially the importance of washing hands frequently and wearing a mask when around others. Pt verbalized understanding. Son called phone number updated. Pt is Spanish speaking please use interpreter when calling pt.
